# Patient Record
Sex: Female | Born: 1975 | Race: White | Hispanic: No | Marital: Married | Smoking: Never smoker
Health system: Southern US, Community
[De-identification: ages and names within clinical notes are randomized; demographics above are authoritative.]

## PROBLEM LIST (undated history)

## (undated) DIAGNOSIS — N200 Calculus of kidney: Secondary | ICD-10-CM

## (undated) DIAGNOSIS — R002 Palpitations: Secondary | ICD-10-CM

## (undated) DIAGNOSIS — E785 Hyperlipidemia, unspecified: Secondary | ICD-10-CM

## (undated) HISTORY — DX: Hyperlipidemia, unspecified: E78.5

## (undated) HISTORY — PX: LITHOTRIPSY: SUR834

## (undated) HISTORY — DX: Palpitations: R00.2

## (undated) HISTORY — DX: Calculus of kidney: N20.0

## (undated) HISTORY — PX: APPENDECTOMY: SHX54

## (undated) HISTORY — PX: BREAST LUMPECTOMY: SHX2

---

## 1998-08-31 ENCOUNTER — Other Ambulatory Visit: Admission: RE | Admit: 1998-08-31 | Discharge: 1998-08-31 | Payer: Self-pay | Admitting: Obstetrics & Gynecology

## 2000-07-04 ENCOUNTER — Encounter: Payer: Self-pay | Admitting: Urology

## 2000-07-04 ENCOUNTER — Ambulatory Visit (HOSPITAL_COMMUNITY): Admission: RE | Admit: 2000-07-04 | Discharge: 2000-07-04 | Payer: Self-pay | Admitting: Urology

## 2000-09-10 ENCOUNTER — Other Ambulatory Visit: Admission: RE | Admit: 2000-09-10 | Discharge: 2000-09-10 | Payer: Self-pay | Admitting: Obstetrics & Gynecology

## 2003-06-11 ENCOUNTER — Other Ambulatory Visit: Admission: RE | Admit: 2003-06-11 | Discharge: 2003-06-11 | Payer: Self-pay | Admitting: Obstetrics & Gynecology

## 2016-12-24 DIAGNOSIS — N841 Polyp of cervix uteri: Secondary | ICD-10-CM | POA: Insufficient documentation

## 2016-12-24 HISTORY — DX: Polyp of cervix uteri: N84.1

## 2016-12-28 HISTORY — PX: CERVICAL POLYPECTOMY: SHX88

## 2017-10-10 ENCOUNTER — Encounter (HOSPITAL_BASED_OUTPATIENT_CLINIC_OR_DEPARTMENT_OTHER): Payer: Self-pay | Admitting: *Deleted

## 2017-10-15 NOTE — H&P (Signed)
Emily [name] 10/01/2017 4:09 PM Location: [facility] Patient #: [id number] DOB: 09/30/1976 Married / Language: [place] / Race: White Female   History of Present Illness (Emily [name]; 10/01/2017 4:46 PM) The patient is a 41 year old female who presents for an evaluation of a hernia. This is a pleasant 41 year old female referred by Dr. Marylynn PearsonBrian [name] for evaluation of a symptomatic hernia just above the umbilicus. She was doing some heavy lifting which no signs hernia bulge out. She was able to reduce it but it was painful. Since then, she's been able to notice a small bulge above the umbilicus which is now fairly asymptomatic. She did not have any obstructive symptoms. She is otherwise healthy without complaints.   Past Surgical History Emily [name](Emily [name], [place]; 10/01/2017 4:09 PM) Appendectomy  Breast Biopsy  Right.  Diagnostic Studies History Emily [name](Emily [name], [place]; 10/01/2017 4:09 PM) Colonoscopy  never Mammogram  within last year Pap Smear  1-5 years ago  Allergies Emily [name](Emily [name], [place]; 10/01/2017 4:11 PM) No Known Drug Allergies 10/01/2017 Allergies Reconciled   Medication History Emily [name](Emily [name], RMA; 10/01/2017 4:12 PM) Probiotic (Oral) Active. Medications Reconciled  Social History Emily [name](Emily [name], [place]; 10/01/2017 4:09 PM) Alcohol use  Occasional alcohol use. Caffeine use  Tea. No drug use  Tobacco use  Never smoker.  Family History Emily [name](Emily [name], [place]; 10/01/2017 4:09 PM) Cancer  Mother. Colon Polyps  Father, Mother. Heart disease in female family member before age 43  Kidney Disease  Father.  Pregnancy / Birth History Emily [name](Emily [name], [place]; 10/01/2017 4:09 PM) Age at menarche  9 years. Gravida  2 Length (months) of breastfeeding  3-6 Maternal age  43-40 Para  1 Regular periods   Other Problems Emily [name](Emily [name], [place]; 10/01/2017 4:09 PM) Arthritis  Kidney Stone  Lump In Breast  Umbilical Hernia Repair     Review of  Systems Emily [name](Emily [name] RMA; 10/01/2017 4:09 PM) General Not Present- Appetite Loss, Chills, Fatigue, Fever, Night Sweats, Weight Gain and Weight Loss. Skin Not Present- Change in Wart/Mole, Dryness, Hives, Jaundice, New Lesions, Non-Healing Wounds, Rash and Ulcer. HEENT Present- Ringing in the Ears. Not Present- Earache, Hearing Loss, Hoarseness, Nose Bleed, Oral Ulcers, Seasonal Allergies, Sinus Pain, Sore Throat, Visual Disturbances, Wears glasses/contact lenses and Yellow Eyes. Respiratory Not Present- Bloody sputum, Chronic Cough, Difficulty Breathing, Snoring and Wheezing. Breast Not Present- Breast Mass, Breast Pain, Nipple Discharge and Skin Changes. Cardiovascular Not Present- Chest Pain, Difficulty Breathing Lying Down, Leg Cramps, Palpitations, Rapid Heart Rate, Shortness of Breath and Swelling of Extremities. Gastrointestinal Not Present- Abdominal Pain, Bloating, Bloody Stool, Change in Bowel Habits, Chronic diarrhea, Constipation, Difficulty Swallowing, Excessive gas, Gets full quickly at meals, Hemorrhoids, Indigestion, Nausea, Rectal Pain and Vomiting. Female Genitourinary Not Present- Frequency, Nocturia, Painful Urination, Pelvic Pain and Urgency. Musculoskeletal Not Present- Back Pain, Joint Pain, Joint Stiffness, Muscle Pain, Muscle Weakness and Swelling of Extremities. Neurological Not Present- Decreased Memory, Fainting, Headaches, Numbness, Seizures, Tingling, Tremor, Trouble walking and Weakness. Psychiatric Not Present- Anxiety, Bipolar, Change in Sleep Pattern, Depression, Fearful and Frequent crying. Endocrine Not Present- Cold Intolerance, Excessive Hunger, Hair Changes, Heat Intolerance, Hot flashes and New Diabetes. Hematology Not Present- Blood Thinners, Easy Bruising, Excessive bleeding, Gland problems, HIV and Persistent Infections.  Vitals Emily [name](Emily [name] RMA; 10/01/2017 4:11 PM) 10/01/2017 4:11 PM Weight: 196 lb Height: 62in Body Surface Area: 1.9 m Body Mass  Index: 35.85 kg/m  Temp.: 97.89F  Pulse: 97 (Regular)  BP: 140/82 (Sitting, Left Arm, Standard)  Physical Exam (Emily [name]; 10/01/2017 4:46 PM) The physical exam findings are as follows: Note:General she is well appearance Lungs are clear bilaterally Cardiovascular is regular rate and rhythm Abdomen is soft and nontender. There is a small, chronically incarcerated, nontender hernia just above the umbilicus containing omentum.    Assessment & Plan (Emily [name]; 10/01/2017 4:47 PM) UMBILICAL HERNIA (K42.9) Impression: I discussed the diagnosis with her in detail. Repair is recommended. I explained the surgical procedure in detail. I explained the risks of the hernia getting larger or becoming emergent if hernia surgery is not proceed. We discussed the risks of surgery which includes but is not limited to bleeding, infection, use of mesh, injury to surrounding structures, recurrent hernia, cardiopulmonary issues, etc. She understands and wished to proceed with surgery which will be scheduled

## 2017-10-16 ENCOUNTER — Ambulatory Visit (HOSPITAL_BASED_OUTPATIENT_CLINIC_OR_DEPARTMENT_OTHER): Payer: BC Managed Care – PPO | Admitting: Anesthesiology

## 2017-10-16 ENCOUNTER — Other Ambulatory Visit: Payer: Self-pay

## 2017-10-16 ENCOUNTER — Other Ambulatory Visit: Payer: Self-pay | Admitting: Surgery

## 2017-10-16 ENCOUNTER — Ambulatory Visit (HOSPITAL_BASED_OUTPATIENT_CLINIC_OR_DEPARTMENT_OTHER)
Admission: RE | Admit: 2017-10-16 | Discharge: 2017-10-16 | Disposition: A | Discharge status: Home / Self Care | Payer: BC Managed Care – PPO | Source: Ambulatory Visit | Attending: Surgery | Admitting: Surgery

## 2017-10-16 ENCOUNTER — Encounter (HOSPITAL_BASED_OUTPATIENT_CLINIC_OR_DEPARTMENT_OTHER): Payer: Self-pay | Admitting: Anesthesiology

## 2017-10-16 ENCOUNTER — Encounter (HOSPITAL_BASED_OUTPATIENT_CLINIC_OR_DEPARTMENT_OTHER)
Admission: RE | Disposition: A | Discharge status: Home / Self Care | Payer: Self-pay | Source: Ambulatory Visit | Attending: Surgery

## 2017-10-16 DIAGNOSIS — E669 Obesity, unspecified: Secondary | ICD-10-CM | POA: Insufficient documentation

## 2017-10-16 DIAGNOSIS — K429 Umbilical hernia without obstruction or gangrene: Secondary | ICD-10-CM | POA: Diagnosis not present

## 2017-10-16 DIAGNOSIS — Z6835 Body mass index (BMI) 35.0-35.9, adult: Secondary | ICD-10-CM | POA: Insufficient documentation

## 2017-10-16 HISTORY — PX: UMBILICAL HERNIA REPAIR: SHX196

## 2017-10-16 SURGERY — REPAIR, HERNIA, UMBILICAL, ADULT
Anesthesia: General | Site: Abdomen

## 2017-10-16 MED ORDER — OXYCODONE HCL 5 MG PO TABS: 5.0000 mg | ORAL_TABLET | Freq: Four times a day (QID) | ORAL | 0 refills | Status: DC | PRN

## 2017-10-16 MED ORDER — DEXAMETHASONE SODIUM PHOSPHATE 4 MG/ML IJ SOLN
INTRAMUSCULAR | Status: DC | PRN
  Administered 2017-10-16: 10 mg via INTRAVENOUS

## 2017-10-16 MED ORDER — FENTANYL CITRATE (PF) 100 MCG/2ML IJ SOLN
INTRAMUSCULAR | Status: AC
  Filled 2017-10-16: qty 2

## 2017-10-16 MED ORDER — LACTATED RINGERS IV SOLN
INTRAVENOUS | Status: DC
  Administered 2017-10-16: 09:00:00 via INTRAVENOUS

## 2017-10-16 MED ORDER — BUPIVACAINE-EPINEPHRINE 0.5% -1:200000 IJ SOLN
INTRAMUSCULAR | Status: DC | PRN
  Administered 2017-10-16: 10 mL

## 2017-10-16 MED ORDER — MEPERIDINE HCL 25 MG/ML IJ SOLN: 6.2500 mg | INTRAMUSCULAR | Status: DC | PRN

## 2017-10-16 MED ORDER — METOCLOPRAMIDE HCL 5 MG/ML IJ SOLN
10.0000 mg | Freq: Once | INTRAMUSCULAR | Status: DC | PRN
Start: 2017-10-16 — End: 2017-10-16

## 2017-10-16 MED ORDER — SCOPOLAMINE 1 MG/3DAYS TD PT72
MEDICATED_PATCH | TRANSDERMAL | Status: AC
  Filled 2017-10-16: qty 1

## 2017-10-16 MED ORDER — LIDOCAINE HCL (CARDIAC) 20 MG/ML IV SOLN
INTRAVENOUS | Status: DC | PRN
  Administered 2017-10-16: 100 mg via INTRAVENOUS

## 2017-10-16 MED ORDER — CHLORHEXIDINE GLUCONATE CLOTH 2 % EX PADS: 6.0000 | MEDICATED_PAD | Freq: Once | CUTANEOUS | Status: AC

## 2017-10-16 MED ORDER — PROPOFOL 10 MG/ML IV BOLUS
INTRAVENOUS | Status: DC | PRN
  Administered 2017-10-16: 200 mg via INTRAVENOUS

## 2017-10-16 MED ORDER — FENTANYL CITRATE (PF) 100 MCG/2ML IJ SOLN
25.0000 ug | INTRAMUSCULAR | Status: DC | PRN
Start: 2017-10-16 — End: 2017-10-16
  Administered 2017-10-16 (×2): 50 ug via INTRAVENOUS

## 2017-10-16 MED ORDER — FENTANYL CITRATE (PF) 100 MCG/2ML IJ SOLN
50.0000 ug | INTRAMUSCULAR | Status: DC | PRN
  Administered 2017-10-16: 100 ug via INTRAVENOUS

## 2017-10-16 MED ORDER — DEXTROSE 5 % IV SOLN
2.0000 g | INTRAVENOUS | Status: AC
  Administered 2017-10-16: 2 g via INTRAVENOUS

## 2017-10-16 MED ORDER — MIDAZOLAM HCL 2 MG/2ML IJ SOLN
INTRAMUSCULAR | Status: AC
  Filled 2017-10-16: qty 2

## 2017-10-16 MED ORDER — KETOROLAC TROMETHAMINE 30 MG/ML IJ SOLN
INTRAMUSCULAR | Status: DC | PRN
  Administered 2017-10-16: 30 mg via INTRAVENOUS

## 2017-10-16 MED ORDER — CEFAZOLIN SODIUM-DEXTROSE 2-4 GM/100ML-% IV SOLN
INTRAVENOUS | Status: AC
  Filled 2017-10-16: qty 100

## 2017-10-16 MED ORDER — SCOPOLAMINE 1 MG/3DAYS TD PT72: 1.0000 | MEDICATED_PATCH | TRANSDERMAL | Status: DC

## 2017-10-16 MED ORDER — SCOPOLAMINE 1 MG/3DAYS TD PT72
1.0000 | MEDICATED_PATCH | Freq: Once | TRANSDERMAL | Status: DC | PRN
  Administered 2017-10-16: 1.5 mg via TRANSDERMAL

## 2017-10-16 MED ORDER — HYDROCODONE-ACETAMINOPHEN 7.5-325 MG PO TABS: 1.0000 | ORAL_TABLET | Freq: Once | ORAL | Status: DC | PRN

## 2017-10-16 MED ORDER — MIDAZOLAM HCL 2 MG/2ML IJ SOLN
1.0000 mg | INTRAMUSCULAR | Status: DC | PRN
  Administered 2017-10-16: 2 mg via INTRAVENOUS

## 2017-10-16 SURGICAL SUPPLY — 43 items
BLADE HEX COATED 2.75 (ELECTRODE) ×4 IMPLANT
BLADE SURG 15 STRL LF DISP TIS (BLADE) ×2 IMPLANT
BLADE SURG 15 STRL SS (BLADE) ×2
CANISTER SUCT 1200ML W/VALVE (MISCELLANEOUS) IMPLANT
CHLORAPREP W/TINT 26ML (MISCELLANEOUS) ×4 IMPLANT
COVER BACK TABLE 60X90IN (DRAPES) ×4 IMPLANT
COVER MAYO STAND STRL (DRAPES) ×4 IMPLANT
DECANTER SPIKE VIAL GLASS SM (MISCELLANEOUS) IMPLANT
DERMABOND ADVANCED (GAUZE/BANDAGES/DRESSINGS) ×2
DERMABOND ADVANCED .7 DNX12 (GAUZE/BANDAGES/DRESSINGS) ×2 IMPLANT
DRAPE LAPAROTOMY 100X72 PEDS (DRAPES) ×4 IMPLANT
DRAPE UTILITY XL STRL (DRAPES) ×4 IMPLANT
ELECT REM PT RETURN 9FT ADLT (ELECTROSURGICAL) ×4
ELECTRODE REM PT RTRN 9FT ADLT (ELECTROSURGICAL) ×2 IMPLANT
GLOVE BIO SURGEON STRL SZ 6.5 (GLOVE) ×3 IMPLANT
GLOVE BIO SURGEONS STRL SZ 6.5 (GLOVE) ×1
GLOVE BIOGEL PI IND STRL 7.0 (GLOVE) ×2 IMPLANT
GLOVE BIOGEL PI INDICATOR 7.0 (GLOVE) ×2
GLOVE SURG SIGNA 7.5 PF LTX (GLOVE) ×4 IMPLANT
GOWN STRL REUS W/ TWL LRG LVL3 (GOWN DISPOSABLE) ×2 IMPLANT
GOWN STRL REUS W/ TWL XL LVL3 (GOWN DISPOSABLE) ×2 IMPLANT
GOWN STRL REUS W/TWL LRG LVL3 (GOWN DISPOSABLE) ×2
GOWN STRL REUS W/TWL XL LVL3 (GOWN DISPOSABLE) ×2
NEEDLE HYPO 25X1 1.5 SAFETY (NEEDLE) ×4 IMPLANT
NS IRRIG 1000ML POUR BTL (IV SOLUTION) IMPLANT
PACK BASIN DAY SURGERY FS (CUSTOM PROCEDURE TRAY) ×4 IMPLANT
PENCIL BUTTON HOLSTER BLD 10FT (ELECTRODE) ×4 IMPLANT
SLEEVE SCD COMPRESS KNEE MED (MISCELLANEOUS) ×4 IMPLANT
SPONGE LAP 4X18 X RAY DECT (DISPOSABLE) ×4 IMPLANT
SUT ETHIBOND 0 MO6 C/R (SUTURE) ×4 IMPLANT
SUT MNCRL AB 4-0 PS2 18 (SUTURE) ×4 IMPLANT
SUT NOVA 0 T19/GS 22DT (SUTURE) IMPLANT
SUT NOVA NAB GS-21 1 T12 (SUTURE) IMPLANT
SUT VIC AB 2-0 SH 27 (SUTURE)
SUT VIC AB 2-0 SH 27XBRD (SUTURE) IMPLANT
SUT VIC AB 3-0 SH 27 (SUTURE) ×2
SUT VIC AB 3-0 SH 27X BRD (SUTURE) ×2 IMPLANT
SYR CONTROL 10ML LL (SYRINGE) ×4 IMPLANT
TOWEL OR 17X24 6PK STRL BLUE (TOWEL DISPOSABLE) ×4 IMPLANT
TOWEL OR NON WOVEN STRL DISP B (DISPOSABLE) ×4 IMPLANT
TUBE CONNECTING 20'X1/4 (TUBING)
TUBE CONNECTING 20X1/4 (TUBING) IMPLANT
YANKAUER SUCT BULB TIP NO VENT (SUCTIONS) IMPLANT

## 2017-10-16 NOTE — Anesthesia Preprocedure Evaluation (Signed)
Anesthesia Evaluation  Patient identified by MRN, date of birth, ID band Patient awake    Reviewed: Allergy & Precautions, NPO status , Patient's Chart, lab work & pertinent test results  Airway Mallampati: II  TM Distance: >3 FB Neck ROM: Full    Dental no notable dental hx. (+) Teeth Intact   Pulmonary neg pulmonary ROS,    Pulmonary exam normal breath sounds clear to auscultation       Cardiovascular negative cardio ROS Normal cardiovascular exam Rhythm:Regular Rate:Normal     Neuro/Psych negative neurological ROS  negative psych ROS   GI/Hepatic negative GI ROS, Neg liver ROS,   Endo/Other  Obesity  Renal/GU Renal diseaseHx/o renal calculi  negative genitourinary   Musculoskeletal Umbilical hernia   Abdominal (+) + obese,   Peds  Hematology negative hematology ROS (+)   Anesthesia Other Findings   Reproductive/Obstetrics                             Anesthesia Physical Anesthesia Plan  ASA: II  Anesthesia Plan: General   Post-op Pain Management:    Induction: Intravenous  PONV Risk Score and Plan: 4 or greater and Scopolamine patch - Pre-op, Midazolam, Dexamethasone, Ondansetron and Treatment may vary due to age or medical condition  Airway Management Planned: Oral ETT  Additional Equipment:   Intra-op Plan:   Post-operative Plan: Extubation in OR  Informed Consent: I have reviewed the patients History and Physical, chart, labs and discussed the procedure including the risks, benefits and alternatives for the proposed anesthesia with the patient or authorized representative who has indicated his/her understanding and acceptance.   Dental advisory given  Plan Discussed with: CRNA, Anesthesiologist and Surgeon  Anesthesia Plan Comments:         Anesthesia Quick Evaluation

## 2017-10-16 NOTE — Discharge Instructions (Signed)
°Post Anesthesia Home Care Instructions ° °Activity: °Get plenty of rest for the remainder of the day. A responsible individual must stay with you for 24 hours following the procedure.  °For the next 24 hours, DO NOT: °-Drive a car °-Operate machinery °-Drink alcoholic beverages °-Take any medication unless instructed by your physician °-Make any legal decisions or sign important papers. ° °Meals: °Start with liquid foods such as gelatin or soup. Progress to regular foods as tolerated. Avoid greasy, spicy, heavy foods. If nausea and/or vomiting occur, drink only clear liquids until the nausea and/or vomiting subsides. Call your physician if vomiting continues. ° °Special Instructions/Symptoms: °Your throat may feel dry or sore from the anesthesia or the breathing tube placed in your throat during surgery. If this causes discomfort, gargle with warm salt water. The discomfort should disappear within 24 hours. ° °If you had a scopolamine patch placed behind your ear for the management of post- operative nausea and/or vomiting: ° °1. The medication in the patch is effective for 72 hours, after which it should be removed.  Wrap patch in a tissue and discard in the trash. Wash hands thoroughly with soap and water. °2. You may remove the patch earlier than 72 hours if you experience unpleasant side effects which may include dry mouth, dizziness or visual disturbances. °3. Avoid touching the patch. Wash your hands with soap and water after contact with the patch. °  ° ° ° ° °[facility] _______[facility] ° °UMBILICAL OR INGUINAL HERNIA REPAIR: POST OP INSTRUCTIONS ° °Always review your discharge instruction sheet given to you by the facility where your surgery was performed. °IF YOU HAVE DISABILITY OR FAMILY LEAVE FORMS, YOU MUST BRING THEM TO THE OFFICE FOR PROCESSING.   °DO NOT GIVE THEM TO YOUR DOCTOR. ° °1. A  prescription for pain medication may be given to you upon discharge.  Take your pain medication as  prescribed, if needed.  If narcotic pain medicine is not needed, then you may take acetaminophen (Tylenol) or ibuprofen (Advil) as needed. °2. Take your usually prescribed medications unless otherwise directed. °If you need a refill on your pain medication, please contact your pharmacy.  They will contact our office to request authorization. Prescriptions will not be filled after 5 pm or on week-ends. °3. You should follow a light diet the first 24 hours after arrival home, such as soup and crackers, etc.  Be sure to include lots of fluids daily.  Resume your normal diet the day after surgery. °4.Most patients will experience some swelling and bruising around the umbilicus or in the groin and scrotum.  Ice packs and reclining will help.  Swelling and bruising can take several days to resolve.  °6. It is common to experience some constipation if taking pain medication after surgery.  Increasing fluid intake and taking a stool softener (such as Colace) will usually help or prevent this problem from occurring.  A mild laxative (Milk of Magnesia or Miralax) should be taken according to package directions if there are no bowel movements after 48 hours. °7. Unless discharge instructions indicate otherwise, you may remove your bandages 24-48 hours after surgery, and you may shower at that time.  You may have steri-strips (small skin tapes) in place directly over the incision.  These strips should be left on the skin for 7-10 days.  If your surgeon used skin glue on the incision, you may shower in 24 hours.  The glue will flake off over the next 2-3 weeks.    Any sutures or staples will be removed at the office during your follow-up visit. °8. ACTIVITIES:  You may resume regular (light) daily activities beginning the next day--such as daily self-care, walking, climbing stairs--gradually increasing activities as tolerated.  You may have sexual intercourse when it is comfortable.  Refrain from any heavy lifting or straining  until approved by your doctor. ° °a.You may drive when you are no longer taking prescription pain medication, you can comfortably wear a seatbelt, and you can safely maneuver your car and apply brakes. °b.RETURN TO WORK:   °_____________________________________________ ° °9.You should see your doctor in the office for a follow-up appointment approximately 2-3 weeks after your surgery.  Make sure that you call for this appointment within a day or two after you arrive home to insure a convenient appointment time. °10.OTHER INSTRUCTIONS: _________________________ °   _____________________________________ ° °WHEN TO CALL YOUR DOCTOR: °1. Fever over 101.0 °2. Inability to urinate °3. Nausea and/or vomiting °4. Extreme swelling or bruising °5. Continued bleeding from incision. °6. Increased pain, redness, or drainage from the incision ° °The clinic staff is available to answer your questions during regular business hours.  Please don’t hesitate to call and ask to speak to one of the nurses for clinical concerns.  If you have a medical emergency, go to the nearest emergency room or call 911.  A surgeon from [facility] is always on call at the hospital ° ° °[address], [address], [place], [place]  [address] ? ° [address] [address], [place], [place]   [address] °[phone] ? [phone] ? FAX [phone] °Web site: [email or web address] °

## 2017-10-16 NOTE — Anesthesia Procedure Notes (Signed)
Procedure Name: LMA Insertion Date/Time: 10/16/2017 10:05 AM Performed by: Ronnette HilaPayne, Kannon Granderson D, CRNA Pre-anesthesia Checklist: Patient identified, Emergency Drugs available, Suction available and Patient being monitored Patient Re-evaluated:Patient Re-evaluated prior to induction Oxygen Delivery Method: Circle system utilized Preoxygenation: Pre-oxygenation with 100% oxygen Induction Type: IV induction Ventilation: Mask ventilation without difficulty LMA: LMA inserted LMA Size: 4.0 Number of attempts: 1 Airway Equipment and Method: Bite block Placement Confirmation: positive ETCO2 Tube secured with: Tape Dental Injury: Teeth and Oropharynx as per pre-operative assessment

## 2017-10-16 NOTE — Transfer of Care (Signed)
Immediate Anesthesia Transfer of Care Note  Patient: Emily [name]  Procedure(s) Performed: UMBILICAL HERNIA REPAIR (N/A Abdomen)  Patient Location: PACU  Anesthesia Type:General  Level of Consciousness: awake, alert  and drowsy  Airway & Oxygen Therapy: Patient Spontanous Breathing and Patient connected to face mask oxygen  Post-op Assessment: Report given to RN and Post -op Vital signs reviewed and stable  Post vital signs: Reviewed and stable  Last Vitals:  Vitals:   10/16/17 0843  BP: 127/71  Pulse: 68  Resp: 20  Temp: 36.6 C  SpO2: 100%    Last Pain:  Vitals:   10/16/17 0843  TempSrc: Oral         Complications: No apparent anesthesia complications

## 2017-10-16 NOTE — Op Note (Signed)
UMBILICAL HERNIA REPAIR  Procedure Note  Emily [name] 10/16/2017   Pre-op Diagnosis: UMBILICAL HERNIA     Post-op Diagnosis: same  Procedure(s): UMBILICAL HERNIA REPAIR  Surgeon(s): Abigail MiyamotoBlackman, Relena Ivancic, MD  Anesthesia: General  Staff:  Circulator: Maryan RuedWeaver, Brandi C, RN Relief Circulator: Salley Scarletavidson, Mary B, RN Relief Scrub: Macie BurowsBowyer, Amy M, RN Scrub Person: Wardell HeathLewis, Dawn M, CST  Estimated Blood Loss: Minimal               Procedure: The patient was brought to the operating room and identified as the correct patient.  She was placed supine on the operating room table and general anesthesia was induced.  Her abdomen was prepped and draped in the usual sterile fashion.  The small hernia was located just above the umbilicus.  I anesthetized the skin with Marcaine and made [name] small vertical incision with [name] scalpel.  I took this down to the small hernia.  The hernia sac was found to contain only preperitoneal fat.  The fascial defect was very small.  I excised the sac and the contents.  I then closed the fascial defect with several figure-of-eight 0 Ethibond sutures.  I anesthetized the fascia first with Marcaine.  Good closure appeared to be achieved.  Hemostasis was achieved.  I then closed the subtenons tissue with interrupted 3-0 Vicryl sutures and closed the skin with [name] running 4-0 Monocryl.  Skin glue was then applied.  The patient tolerated the procedure well.  All the counts were correct at the end of the procedure.  The patient was then extubated in the operating room and taken in [name] stable condition to the recovery room.          Emily [name]   Date: 10/16/2017  Time: 10:36 AM

## 2017-10-16 NOTE — Anesthesia Postprocedure Evaluation (Addendum)
Anesthesia Post Note  Patient: Emily [name]  Procedure(s) Performed: UMBILICAL HERNIA REPAIR (N/A Abdomen)     Patient location during evaluation: PACU Anesthesia Type: General Level of consciousness: awake and alert and oriented Pain management: pain level controlled Vital Signs Assessment: post-procedure vital signs reviewed and stable Respiratory status: spontaneous breathing, nonlabored ventilation and respiratory function stable Cardiovascular status: blood pressure returned to baseline and stable Postop Assessment: no apparent nausea or vomiting Anesthetic complications: no    Last Vitals:  Vitals:   10/16/17 1115 10/16/17 1131  BP: 116/73 112/68  Pulse: 71 80  Resp: 10 16  Temp:  36.6 C  SpO2: 94% 95%    Last Pain:  Vitals:   10/16/17 1131  TempSrc:   PainSc: 2                  Summer Mccolgan A.

## 2017-10-16 NOTE — Interval H&P Note (Signed)
History and Physical Interval Note: no change in H and P  10/16/2017 9:42 AM  Emily [name]  has presented today for surgery, with the diagnosis of UMBILICAL HERNIA  The various methods of treatment have been discussed with the patient and family. After consideration of risks, benefits and other options for treatment, the patient has consented to  Procedure(s): UMBILICAL HERNIA REPAIR WITH POSSIBLE MESH (N/A) INSERTION OF MESH (N/A) as a surgical intervention .  The patient's history has been reviewed, patient examined, no change in status, stable for surgery.  I have reviewed the patient's chart and labs.  Questions were answered to the patient's satisfaction.     Archie Atilano A

## 2017-10-17 ENCOUNTER — Encounter (HOSPITAL_BASED_OUTPATIENT_CLINIC_OR_DEPARTMENT_OTHER): Payer: Self-pay | Admitting: Surgery

## 2019-01-04 ENCOUNTER — Other Ambulatory Visit: Payer: Self-pay

## 2019-01-04 ENCOUNTER — Encounter (HOSPITAL_COMMUNITY): Payer: Self-pay | Admitting: Emergency Medicine

## 2019-01-04 ENCOUNTER — Emergency Department (HOSPITAL_COMMUNITY)
Admission: EM | Admit: 2019-01-04 | Discharge: 2019-01-04 | Disposition: A | Discharge status: Home / Self Care | Payer: BC Managed Care – PPO | Attending: Emergency Medicine | Admitting: Emergency Medicine

## 2019-01-04 ENCOUNTER — Emergency Department (HOSPITAL_COMMUNITY): Payer: BC Managed Care – PPO

## 2019-01-04 DIAGNOSIS — K5732 Diverticulitis of large intestine without perforation or abscess without bleeding: Secondary | ICD-10-CM | POA: Diagnosis not present

## 2019-01-04 DIAGNOSIS — R1084 Generalized abdominal pain: Secondary | ICD-10-CM | POA: Diagnosis present

## 2019-01-04 DIAGNOSIS — Z79899 Other long term (current) drug therapy: Secondary | ICD-10-CM | POA: Diagnosis not present

## 2019-01-04 LAB — COMPREHENSIVE METABOLIC PANEL
ALT: 30 U/L (ref 0–44)
AST: 29 U/L (ref 15–41)
Albumin: 3.8 g/dL (ref 3.5–5.0)
Alkaline Phosphatase: 94 U/L (ref 38–126)
Anion gap: 11 (ref 5–15)
BUN: 7 mg/dL (ref 6–20)
CO2: 23 mmol/L (ref 22–32)
Calcium: 9 mg/dL (ref 8.9–10.3)
Chloride: 100 mmol/L (ref 98–111)
Creatinine, Ser: 0.74 mg/dL (ref 0.44–1.00)
GFR calc Af Amer: >60 mL/min (ref >60)
GFR calc non Af Amer: >60 mL/min (ref >60)
Glucose, Bld: 101 mg/dL — ABNORMAL HIGH (ref 70–99)
Potassium: 3.5 mmol/L (ref 3.5–5.1)
Sodium: 134 mmol/L — ABNORMAL LOW (ref 135–145)
Total Bilirubin: 1 mg/dL (ref 0.3–1.2)
Total Protein: 7.3 g/dL (ref 6.5–8.1)

## 2019-01-04 LAB — CBC WITH DIFFERENTIAL/PLATELET
Abs Immature Granulocytes: 0.1 10*3/uL — ABNORMAL HIGH (ref 0.00–0.07)
Basophils Absolute: 0.1 10*3/uL (ref 0.0–0.1)
Basophils Relative: 0 %
Eosinophils Absolute: 0.1 10*3/uL (ref 0.0–0.5)
Eosinophils Relative: 0 %
HCT: 40.9 % (ref 36.0–46.0)
Hemoglobin: 13.2 g/dL (ref 12.0–15.0)
Immature Granulocytes: 1 %
Lymphocytes Relative: 10 %
Lymphs Abs: 1.8 10*3/uL (ref 0.7–4.0)
MCH: 28.1 pg (ref 26.0–34.0)
MCHC: 32.3 g/dL (ref 30.0–36.0)
MCV: 87 fL (ref 80.0–100.0)
Monocytes Absolute: 1.3 10*3/uL — ABNORMAL HIGH (ref 0.1–1.0)
Monocytes Relative: 7 %
Neutro Abs: 15.5 10*3/uL — ABNORMAL HIGH (ref 1.7–7.7)
Neutrophils Relative %: 82 %
Platelets: 274 10*3/uL (ref 150–400)
RBC: 4.7 MIL/uL (ref 3.87–5.11)
RDW: 12 % (ref 11.5–15.5)
WBC: 18.8 10*3/uL — ABNORMAL HIGH (ref 4.0–10.5)
nRBC: 0 % (ref 0.0–0.2)

## 2019-01-04 LAB — I-STAT BETA HCG BLOOD, ED (MC, WL, AP ONLY): I-stat hCG, quantitative: <5 m[IU]/mL (ref <5)

## 2019-01-04 LAB — LIPASE, BLOOD: Lipase: 20 U/L (ref 11–51)

## 2019-01-04 LAB — URINALYSIS, ROUTINE W REFLEX MICROSCOPIC
Bilirubin Urine: NEGATIVE
Glucose, UA: NEGATIVE mg/dL
Hgb urine dipstick: NEGATIVE
Ketones, ur: 20 mg/dL — AB
Leukocytes,Ua: NEGATIVE
Nitrite: NEGATIVE
Protein, ur: NEGATIVE mg/dL
Specific Gravity, Urine: 1.024 (ref 1.005–1.030)
pH: 6 (ref 5.0–8.0)

## 2019-01-04 MED ORDER — SODIUM CHLORIDE 0.9 % IV BOLUS
1000.0000 mL | Freq: Once | INTRAVENOUS | Status: AC
  Administered 2019-01-04: 1000 mL via INTRAVENOUS

## 2019-01-04 MED ORDER — OXYCODONE-ACETAMINOPHEN 5-325 MG PO TABS: 1.0000 | ORAL_TABLET | ORAL | 0 refills | Status: DC | PRN

## 2019-01-04 MED ORDER — ONDANSETRON HCL 4 MG/2ML IJ SOLN
4.0000 mg | Freq: Once | INTRAMUSCULAR | Status: AC
  Administered 2019-01-04: 4 mg via INTRAVENOUS
  Filled 2019-01-04: qty 2

## 2019-01-04 MED ORDER — SODIUM CHLORIDE 0.9 % IV SOLN
2.0000 g | Freq: Once | INTRAVENOUS | Status: AC
  Administered 2019-01-04: 2 g via INTRAVENOUS
  Filled 2019-01-04: qty 20

## 2019-01-04 MED ORDER — METRONIDAZOLE IN NACL 5-0.79 MG/ML-% IV SOLN
500.0000 mg | Freq: Once | INTRAVENOUS | Status: AC
  Administered 2019-01-04: 500 mg via INTRAVENOUS
  Filled 2019-01-04: qty 100

## 2019-01-04 MED ORDER — CIPROFLOXACIN HCL 500 MG PO TABS: 500.0000 mg | ORAL_TABLET | Freq: Two times a day (BID) | ORAL | 0 refills | Status: AC

## 2019-01-04 MED ORDER — ONDANSETRON 4 MG PO TBDP: 4.0000 mg | ORAL_TABLET | Freq: Three times a day (TID) | ORAL | 0 refills | Status: DC | PRN

## 2019-01-04 MED ORDER — IOHEXOL 300 MG/ML  SOLN
100.0000 mL | Freq: Once | INTRAMUSCULAR | Status: AC | PRN
  Administered 2019-01-04: 100 mL via INTRAVENOUS

## 2019-01-04 MED ORDER — METRONIDAZOLE 500 MG PO TABS: 500.0000 mg | ORAL_TABLET | Freq: Three times a day (TID) | ORAL | 0 refills | Status: AC

## 2019-01-04 MED ORDER — FAMOTIDINE IN NACL 20-0.9 MG/50ML-% IV SOLN
20.0000 mg | Freq: Once | INTRAVENOUS | Status: AC
  Administered 2019-01-04: 20 mg via INTRAVENOUS
  Filled 2019-01-04: qty 50

## 2019-01-04 NOTE — ED Triage Notes (Signed)
Pt comes from urgent care. Pt complains of abdominal pain. Pt had her blood work drawn and her WBC count was 19.9

## 2019-01-04 NOTE — ED Provider Notes (Signed)
[facility] EMERGENCY DEPARTMENT Provider Note   CSN: [id number] Arrival date & time: 01/04/19  1625    History   Chief Complaint No chief complaint on file.   HPI Carmin Orta is a 43 y.o. female with a past surgical history of appendectomy, umbilical hernia repair presents to ED for mid abdominal pain, nausea for the past 2 days.  She was seen and evaluated at urgent care and sent to the ED due to leukocytosis of 19.  She states that the pain is intermittent, no improvement with stool softener, antacid or pain medication left over from kidney stone.  She had a normal bowel movement yesterday and today.  She denies history of similar symptoms in the past.  No sick contacts with similar symptoms.  Denies any urinary symptoms, vaginal complaints. Cannot recall any suspicious food ingestions or recent travel.  States that they also checked a urinalysis at urgent care which was unremarkable.  Denies any alcohol, tobacco or other drug use, chest pain or shortness of breath.     HPI  History reviewed. No pertinent past medical history.  There are no active problems to display for this patient.   Past Surgical History:  Procedure Laterality Date  . APPENDECTOMY    . BREAST LUMPECTOMY    . LITHOTRIPSY    . UMBILICAL HERNIA REPAIR N/A 10/16/2017   Procedure: UMBILICAL HERNIA REPAIR;  Surgeon: Abigail Miyamoto, MD;  Location: [facility];  Service: General;  Laterality: N/A;     OB History   No obstetric history on file.      Home Medications    Prior to Admission medications   Medication Sig Start Date End Date Taking? Authorizing Provider  Docusate Sodium (COLACE PO) Take 1 capsule by mouth daily as needed (constipation).   Yes [provider]  Probiotic Product (PROBIOTIC-10 PO) Take 1 tablet by mouth daily as needed (immune boost).    Yes [provider]  Simethicone (GAS-X PO) Take 1 tablet by mouth once.   Yes [provider]  ciprofloxacin (CIPRO) 500 MG tablet Take 1 tablet (500 mg total) by mouth every 12 (twelve) hours for 7 days. 01/04/19 01/11/19  Rudolph Daoust, PA-C  metroNIDAZOLE (FLAGYL) 500 MG tablet Take 1 tablet (500 mg total) by mouth 3 (three) times daily for 7 days. 01/04/19 01/11/19  Khrystyne Arpin, PA-C  ondansetron (ZOFRAN ODT) 4 MG disintegrating tablet Take 1 tablet (4 mg total) by mouth every 8 (eight) hours as needed for nausea or vomiting. 01/04/19   Cadi Rhinehart, PA-C  oxyCODONE (OXY IR/ROXICODONE) 5 MG immediate release tablet Take 1-2 tablets (5-10 mg total) by mouth every 6 (six) hours as needed for moderate pain, severe pain or breakthrough pain. Patient not taking: Reported on 01/04/2019 10/16/17   Abigail Miyamoto, MD  oxyCODONE-acetaminophen (PERCOCET/ROXICET) 5-325 MG tablet Take 1 tablet by mouth every 4 (four) hours as needed for severe pain. 01/04/19   Dietrich Pates, PA-C    Family History No family history on file.  Social History Social History   Tobacco Use  . Smoking status: Never Smoker  . Smokeless tobacco: Never Used  Substance Use Topics  . Alcohol use: No    Frequency: Never  . Drug use: No     Allergies   Patient has no known allergies.   Review of Systems Review of Systems  Constitutional: Negative for appetite change, chills and fever.  HENT: Negative for ear pain, rhinorrhea, sneezing and sore throat.  Eyes: Negative for photophobia and visual disturbance.  Respiratory: Negative for cough, chest tightness, shortness of breath and wheezing.   Cardiovascular: Negative for chest pain and palpitations.  Gastrointestinal: Positive for abdominal pain and nausea. Negative for blood in stool, constipation, diarrhea and vomiting.  Genitourinary: Negative for dysuria, hematuria and urgency.  Musculoskeletal: Negative for myalgias.  Skin: Negative for rash.  Neurological: Negative for dizziness, weakness and light-headedness.     Physical Exam Updated  Vital Signs BP 118/77 (BP Location: Right Arm)   Pulse (!) 103   Temp 98.4 F (36.9 C) (Oral)   Resp 20   Ht 5\' 2"  (1.575 m)   Wt 90.3 kg   SpO2 96%   BMI 36.40 kg/m   Physical Exam Vitals signs and nursing note reviewed.  Constitutional:      General: She is not in acute distress.    Appearance: She is well-developed.  HENT:     Head: Normocephalic and atraumatic.     Nose: Nose normal.  Eyes:     General: No scleral icterus.       Right eye: No discharge.        Left eye: No discharge.     Conjunctiva/sclera: Conjunctivae normal.  Neck:     Musculoskeletal: Normal range of motion and neck supple.  Cardiovascular:     Rate and Rhythm: Normal rate and regular rhythm.     Heart sounds: Normal heart sounds. No murmur. No friction rub. No gallop.   Pulmonary:     Effort: Pulmonary effort is normal. No respiratory distress.     Breath sounds: Normal breath sounds.  Abdominal:     General: Bowel sounds are normal. There is no distension.     Palpations: Abdomen is soft.     Tenderness: There is abdominal tenderness (Epigastric/periumbilical). There is no right CVA tenderness, left CVA tenderness, guarding or rebound.  Musculoskeletal: Normal range of motion.  Skin:    General: Skin is warm and dry.     Findings: No rash.  Neurological:     Mental Status: She is alert.     Motor: No abnormal muscle tone.     Coordination: Coordination normal.      ED Treatments / Results  Labs (all labs ordered are listed, but only abnormal results are displayed) Labs Reviewed  COMPREHENSIVE METABOLIC PANEL - Abnormal; Notable for the following components:      Result Value   Sodium 134 (*)    Glucose, Bld 101 (*)    All other components within normal limits  CBC WITH DIFFERENTIAL/PLATELET - Abnormal; Notable for the following components:   WBC 18.8 (*)    Neutro Abs 15.5 (*)    Monocytes Absolute 1.3 (*)    Abs Immature Granulocytes 0.10 (*)    All other components within  normal limits  URINALYSIS, ROUTINE W REFLEX MICROSCOPIC - Abnormal; Notable for the following components:   Ketones, ur 20 (*)    All other components within normal limits  LIPASE, BLOOD  I-STAT BETA HCG BLOOD, ED ([facility], [facility], [facility] ONLY)    EKG None  Radiology Ct Abdomen Pelvis W Contrast  Result Date: 01/04/2019 CLINICAL DATA:  Midline abdominal pain. Abdominal pain, acute, generalized. Personal history of umbilical hernia repair, lithotripsy, and appendectomy. EXAM: CT ABDOMEN AND PELVIS WITH CONTRAST TECHNIQUE: Multidetector CT imaging of the abdomen and pelvis was performed using the standard protocol following bolus administration of intravenous contrast. CONTRAST:  OMNIPAQUE IOHEXOL 300 MG/ML  SOLN COMPARISON:  CT of the abdomen and pelvis 05/13/2015 FINDINGS: Lower chest: Lung bases are clear without focal nodule, mass, or airspace disease. Heart size is normal. No significant pleural or pericardial effusion is present. Hepatobiliary: 2 cm cyst in the right lobe of the liver is stable. Hypodense lesion inferiorly in the right lobe is also stable. No new lesions are present. The common bile duct and gallbladder normal. Pancreas: Unremarkable. No pancreatic ductal dilatation or surrounding inflammatory changes. Spleen: Normal in size without focal abnormality. Adrenals/Urinary Tract: Adrenal glands are normal bilaterally. Lower pole central sinus cyst of the right kidney has increased in size, now measuring up to 4.4 cm. No other focal renal lesions are present. There is no stone or obstruction. The urinary bladder is within normal limits. Stomach/Bowel: Stomach and duodenum are within normal limits. Small bowel is within normal limits proximally. There is some secondary inflammatory change distally. Appendectomy is noted. The ascending colon is within normal limits. Transverse and proximal descending colon are normal. There are some diverticular changes in the distal descending colon. Extensive  inflammatory changes are present about the sigmoid colon compatible with acute diverticulitis. There is no discrete abscess or free air. Vascular/Lymphatic: No significant vascular findings are present. No enlarged abdominal or pelvic lymph nodes. Reproductive: A 4 cm fibroid is present at the uterine fundus. Uterus is otherwise within normal limits. Multiple follicles are present in the left adnexa measuring up to 2.4 cm. Other: No free fluid or free air is present. No significant ventral hernia is present. Musculoskeletal: Transitional S1 segment is present. A vacuum disc is present at L5-S1. Pelvis is intact. Hips are located and normal. IMPRESSION: 1. Sigmoid diverticulitis without abscess or perforation. 2. Secondary inflammatory changes of the distal small bowel without obstruction. 3. Enlarging central sinus cyst of the right kidney. 4. Benign liver cysts. Electronically Signed   By: Marin Roberts M.D.   On: 01/04/2019 19:03    Procedures Procedures (including critical care time)  Medications Ordered in ED Medications  sodium chloride 0.9 % bolus 1,000 mL (0 mLs Intravenous Stopped 01/04/19 1830)  ondansetron (ZOFRAN) injection 4 mg (4 mg Intravenous Given 01/04/19 1728)  famotidine (PEPCID) IVPB 20 mg premix (0 mg Intravenous Stopped 01/04/19 1810)  iohexol (OMNIPAQUE) 300 MG/ML solution 100 mL (100 mLs Intravenous Contrast Given 01/04/19 1806)  cefTRIAXone (ROCEPHIN) 2 g in sodium chloride 0.9 % 100 mL IVPB (0 g Intravenous Stopped 01/04/19 2008)    And  metroNIDAZOLE (FLAGYL) IVPB 500 mg (0 mg Intravenous Stopped 01/04/19 2139)     Initial Impression / Assessment and Plan / ED Course  I have reviewed the triage vital signs and the nursing notes.  Pertinent labs & imaging results that were available during my care of the patient were reviewed by me and considered in my medical decision making (see chart for details).        43yo F presents to ED for mid abdominal pain, nausea for the  past 2 days.  She was sent to the ED from urgent care for leukocytosis at 19.  She has no improvement with stool softener, antacid or pain medication.  Normal bowel movement yesterday and today.  On my exam tenderness palpation of the abdomen without rebound or guarding.  Her vital signs are within normal limits.  Leukocytosis of 18.8 here.  CMP, lipase, hCG unremarkable.  Urinalysis with ketones with no acute abnormality.  CT shows uncomplicated sigmoid diverticulitis.  Patient given IV antibiotics here will be discharged with remainder of  p.o. course.  Will give PRN pain medication and Zofran and have her follow-up with PCP.  Patient comfortable here with improvement in symptoms.  Patient is hemodynamically stable, in NAD, and able to ambulate in the ED. Evaluation does not show pathology that would require ongoing emergent intervention or inpatient treatment. I explained the diagnosis to the patient. Pain has been managed and has no complaints prior to discharge. Patient is comfortable with above plan and is stable for discharge at this time. All questions were answered prior to disposition. Strict return precautions for returning to the ED were discussed. Encouraged follow up with PCP.    Portions of this note were generated with Scientist, clinical (histocompatibility and immunogenetics). Dictation errors may occur despite best attempts at proofreading.   Final Clinical Impressions(s) / ED Diagnoses   Final diagnoses:  Diverticulitis of large intestine without perforation or abscess without bleeding    ED Discharge Orders         Ordered    metroNIDAZOLE (FLAGYL) 500 MG tablet  3 times daily     01/04/19 2031    ciprofloxacin (CIPRO) 500 MG tablet  Every 12 hours     01/04/19 2031    oxyCODONE-acetaminophen (PERCOCET/ROXICET) 5-325 MG tablet  Every 4 hours PRN     01/04/19 2031    ondansetron (ZOFRAN ODT) 4 MG disintegrating tablet  Every 8 hours PRN     01/04/19 2031           Dietrich Pates, PA-C 01/04/19 2306      Melene Plan, DO 01/04/19 2311

## 2019-01-04 NOTE — ED Notes (Signed)
Patient verbalizes understanding of discharge instructions. Opportunity for questioning and answers were provided. Armband removed by staff, pt discharged from ED, ambulatory to lobby with significant other.  

## 2019-01-04 NOTE — Discharge Instructions (Addendum)
Please take the entire course of antibiotics regardless of symptom improvement to prevent worsening or recurrence of your infection. Follow-up with your primary care provider. Return to the ED for worsening symptoms, fever, severe abdominal pain, blood in your stool.

## 2019-01-04 NOTE — ED Notes (Signed)
Patient transported to CT 

## 2020-05-11 IMAGING — CT CT ABD-PELV W/ CM
2 of 5 series · 16 of 46 positions shown, 18 images · IV contrast (Omni 300)
Comparison: CT of the abdomen and pelvis 05/13/2015

CLINICAL DATA: Midline abdominal pain. Abdominal pain, acute,
generalized. Personal history of umbilical hernia repair,
lithotripsy, and appendectomy.

EXAM:
CT ABDOMEN AND PELVIS WITH CONTRAST
TECHNIQUE: Multidetector CT imaging of the abdomen and pelvis was performed
using the standard protocol following bolus administration of
intravenous contrast.
CONTRAST:  100mL OMNIPAQUE IOHEXOL 300 MG/ML  SOLN

[Series 3: a/p w/ 5mm · axial · 0.89mm/px · z∈[+746,+1166]mm · 13 of 94 slices shown, 15 images]
[im 5/94  soft-tissue]
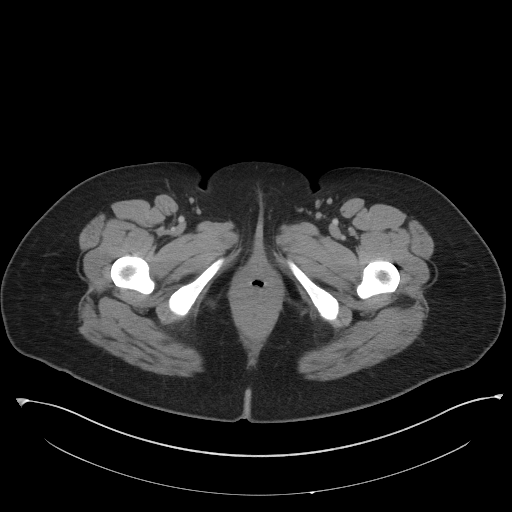
[im 5/94  bone]
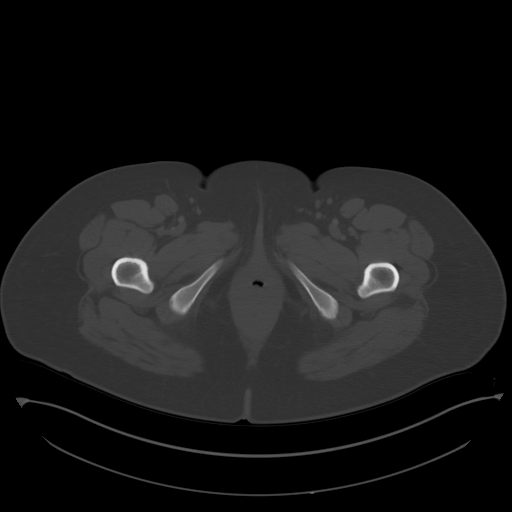
[im 15/94  soft-tissue]
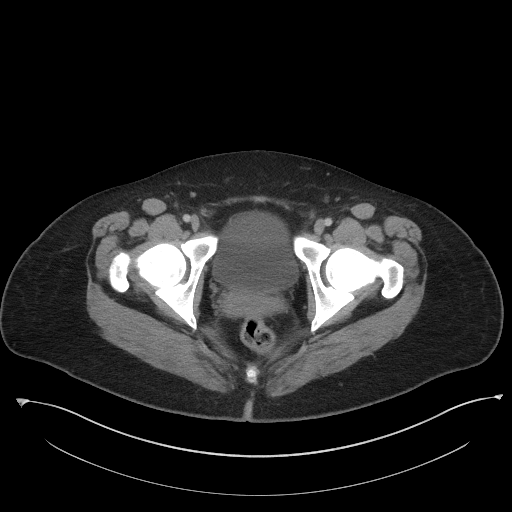
[im 20/94  soft-tissue]
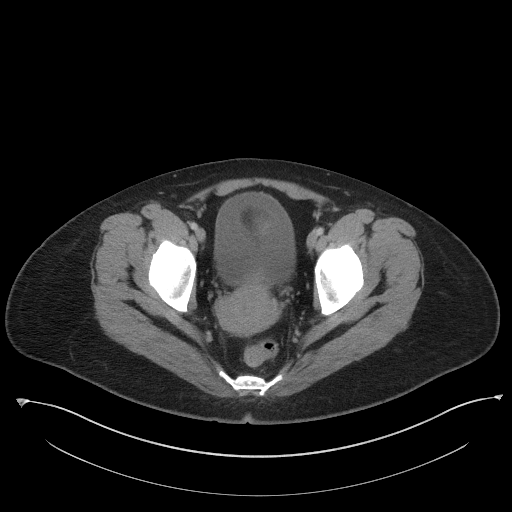
[im 25/94  soft-tissue]
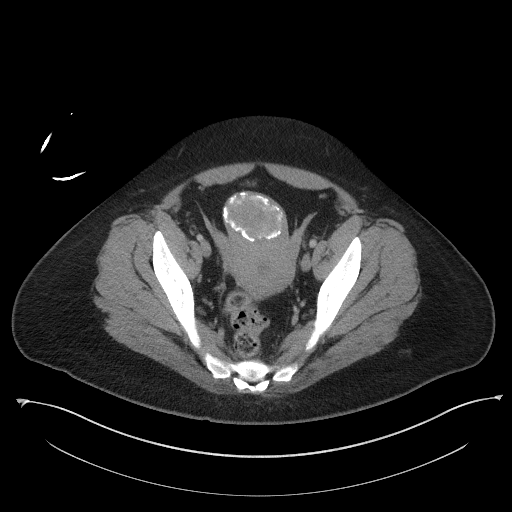
[im 35/94  soft-tissue]
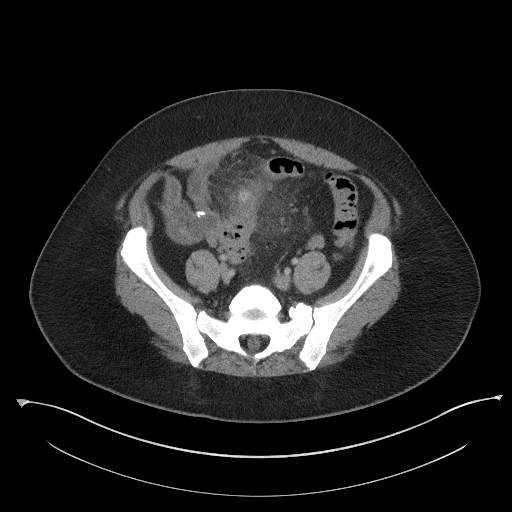
[im 40/94  soft-tissue]
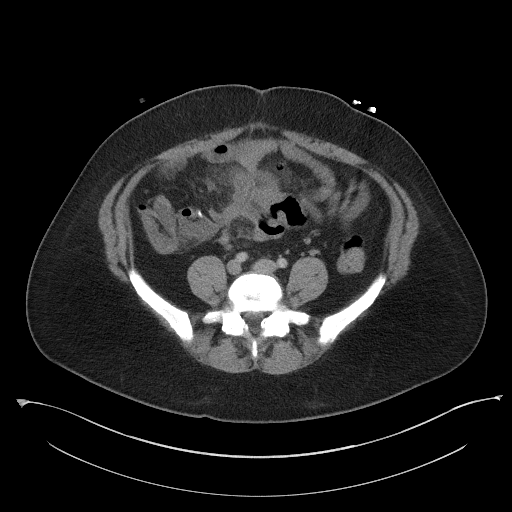
[im 49/94  soft-tissue]
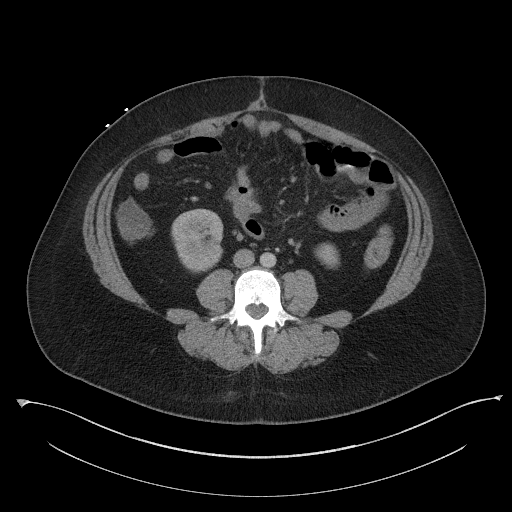
[im 54/94  soft-tissue]
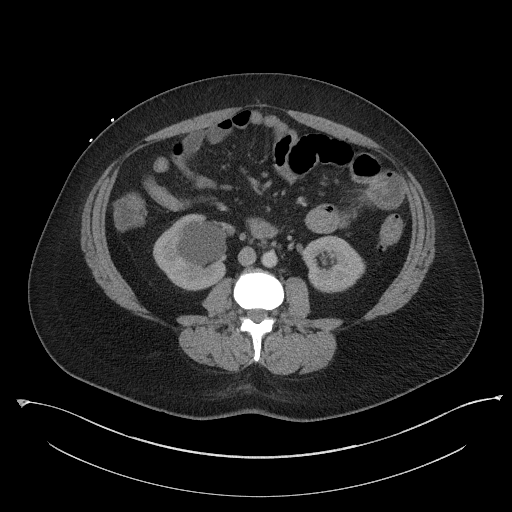
[im 59/94  soft-tissue]
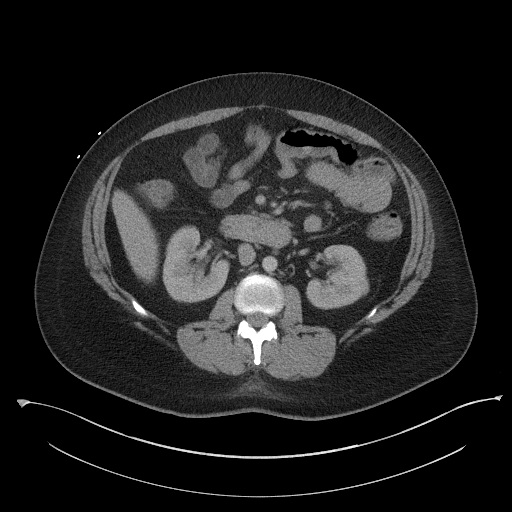
[im 59/94  bone]
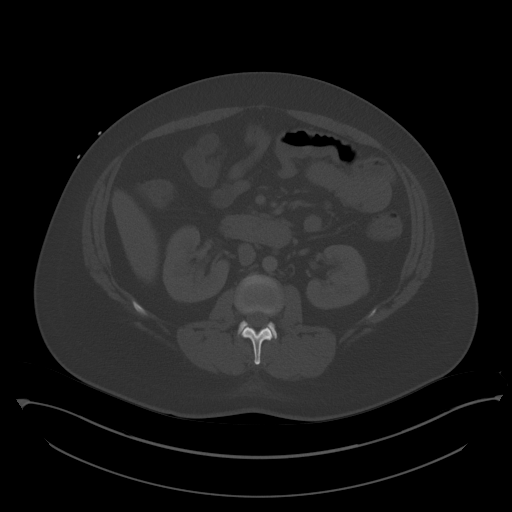
[im 69/94  soft-tissue]
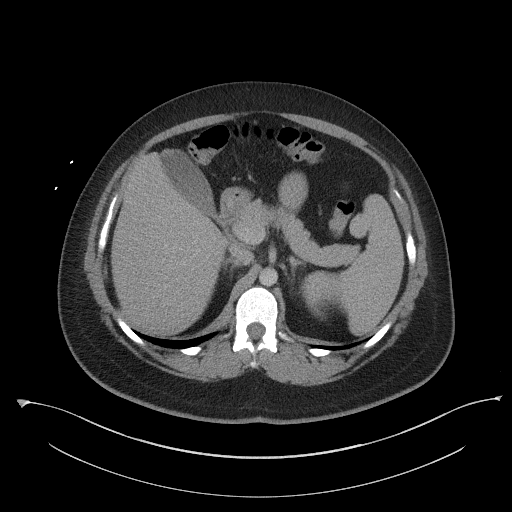
[im 74/94  soft-tissue]
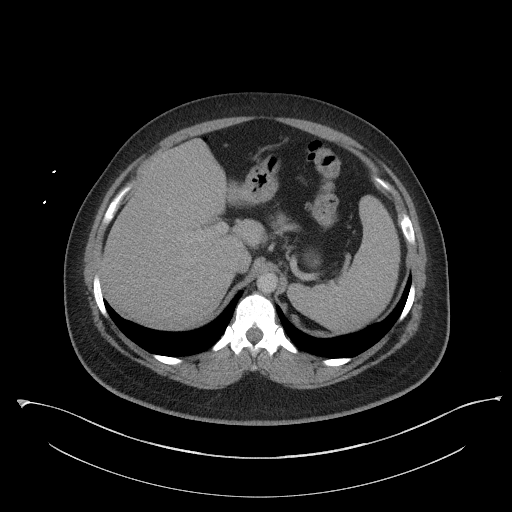
[im 79/94  soft-tissue]
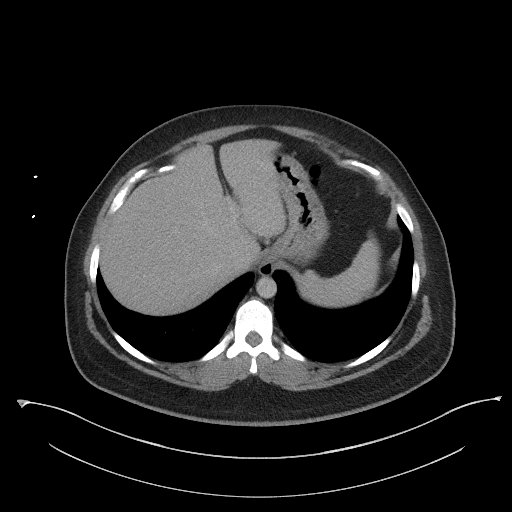
[im 89/94  soft-tissue]
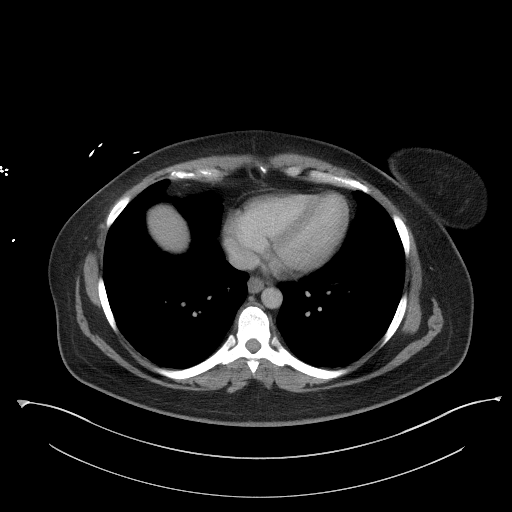

[Series 6: a/p w/ cor · coronal · 0.81mm/px · 3 of 150 slices shown]
[im 50/150  soft-tissue]
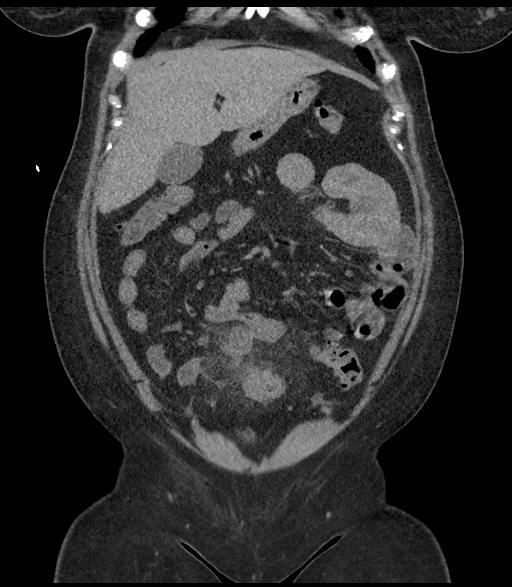
[im 67/150  soft-tissue]
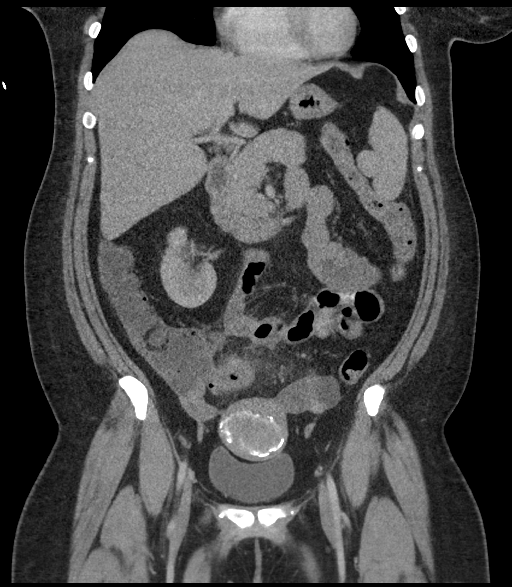
[im 83/150  soft-tissue]
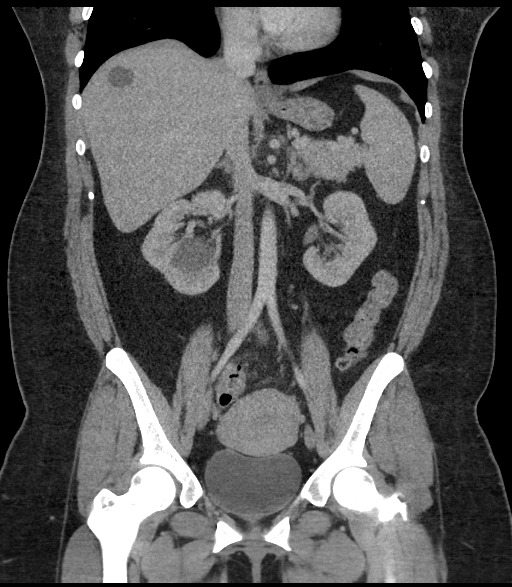

[16 of 46 positions shown; findings below may reference images not displayed]

FINDINGS: Lower chest: Lung bases are clear without focal nodule, mass, or
airspace disease. Heart size is normal. No significant pleural or
pericardial effusion is present.

Hepatobiliary: 2 cm cyst in the right lobe of the liver is stable.
Hypodense lesion inferiorly in the right lobe is also stable. No new
lesions are present. The common bile duct and gallbladder normal.

Pancreas: Unremarkable. No pancreatic ductal dilatation or
surrounding inflammatory changes.

Spleen: Normal in size without focal abnormality.

Adrenals/Urinary Tract: Adrenal glands are normal bilaterally. Lower
pole central sinus cyst of the right kidney has increased in size,
now measuring up to 4.4 cm. No other focal renal lesions are
present. There is no stone or obstruction. The urinary bladder is
within normal limits.

Stomach/Bowel: Stomach and duodenum are within normal limits. Small
bowel is within normal limits proximally. There is some secondary
inflammatory change distally. Appendectomy is noted. The ascending
colon is within normal limits. Transverse and proximal descending
colon are normal. There are some diverticular changes in the distal
descending colon. Extensive inflammatory changes are present about
the sigmoid colon compatible with acute diverticulitis. There is no
discrete abscess or free air.

Vascular/Lymphatic: No significant vascular findings are present. No
enlarged abdominal or pelvic lymph nodes.

Reproductive: A 4 cm fibroid is present at the uterine fundus.
Uterus is otherwise within normal limits. Multiple follicles are
present in the left adnexa measuring up to 2.4 cm.

Other: No free fluid or free air is present. No significant ventral
hernia is present.

Musculoskeletal: Transitional S1 segment is present. A vacuum disc
is present at L5-S1. Pelvis is intact. Hips are located and normal.
IMPRESSION: 1. Sigmoid diverticulitis without abscess or perforation.
2. Secondary inflammatory changes of the distal small bowel without
obstruction.
3. Enlarging central sinus cyst of the right kidney.
4. Benign liver cysts.

## 2024-02-11 ENCOUNTER — Encounter: Payer: Self-pay | Admitting: Cardiology

## 2024-02-11 ENCOUNTER — Encounter: Payer: Self-pay | Admitting: *Deleted

## 2024-02-11 DIAGNOSIS — N200 Calculus of kidney: Secondary | ICD-10-CM | POA: Insufficient documentation

## 2024-02-12 DIAGNOSIS — R002 Palpitations: Secondary | ICD-10-CM | POA: Insufficient documentation

## 2024-02-12 DIAGNOSIS — E785 Hyperlipidemia, unspecified: Secondary | ICD-10-CM | POA: Insufficient documentation

## 2024-02-18 ENCOUNTER — Encounter: Payer: Self-pay | Admitting: Cardiology

## 2024-02-18 ENCOUNTER — Ambulatory Visit: Attending: Cardiology | Admitting: Cardiology

## 2024-02-18 VITALS — BP 128/88 | HR 81 | Ht 62.0 in | Wt 206.8 lb

## 2024-02-18 DIAGNOSIS — R0609 Other forms of dyspnea: Secondary | ICD-10-CM

## 2024-02-18 DIAGNOSIS — R072 Precordial pain: Secondary | ICD-10-CM

## 2024-02-18 DIAGNOSIS — R002 Palpitations: Secondary | ICD-10-CM | POA: Diagnosis not present

## 2024-02-18 DIAGNOSIS — R0789 Other chest pain: Secondary | ICD-10-CM

## 2024-02-18 DIAGNOSIS — E785 Hyperlipidemia, unspecified: Secondary | ICD-10-CM | POA: Diagnosis not present

## 2024-02-18 DIAGNOSIS — I471 Supraventricular tachycardia, unspecified: Secondary | ICD-10-CM

## 2024-02-18 MED ORDER — METOPROLOL TARTRATE 100 MG PO TABS: ORAL_TABLET | ORAL | 0 refills | Status: DC

## 2024-02-18 NOTE — Patient Instructions (Signed)
 Medication Instructions:   TAKE: Metoprolol  100mg  1 tablet 2 hours prior to CT Scan   Lab Work: None Ordered If you have labs (blood work) drawn today and your tests are completely normal, you will receive your results only by: MyChart Message (if you have MyChart) OR A paper copy in the mail If you have any lab test that is abnormal or we need to change your treatment, we will call you to review the results.   Testing/Procedures:  Your physician has requested that you have an echocardiogram. Echocardiography is a painless test that uses sound waves to create images of your heart. It provides your doctor with information about the size and shape of your heart and how well your heart's chambers and valves are working. This procedure takes approximately one hour. There are no restrictions for this procedure. Please do NOT wear cologne, perfume, aftershave, or lotions (deodorant is allowed). Please arrive 15 minutes prior to your appointment time.  Please note: We ask at that you not bring children with you during ultrasound (echo/ vascular) testing. Due to room size and safety concerns, children are not allowed in the ultrasound rooms during exams. Our front office staff cannot provide observation of children in our lobby area while testing is being conducted. An adult accompanying a patient to their appointment will only be allowed in the ultrasound room at the discretion of the ultrasound technician under special circumstances. We apologize for any inconvenience.   Your cardiac CT will be scheduled at one of the below locations:   [facility] [address] [place], [place] [address]  Please follow these instructions carefully (unless otherwise directed):    On the Night Before the Test: Be sure to Drink plenty of water. Do not consume any caffeinated/decaffeinated beverages or chocolate 12 hours prior to your test. Do not take any antihistamines 12 hours prior to your  test.  On the Day of the Test: Drink plenty of water until 1 hour prior to the test. Do not eat any food 4 hours prior to the test. You may take your regular medications prior to the test.  Take metoprolol  (Lopressor ) two hours prior to test. FEMALES- please wear underwire-free bra if available, avoid dresses & tight clothing       After the Test: Drink plenty of water. After receiving IV contrast, you may experience a mild flushed feeling. This is normal. On occasion, you may experience a mild rash up to 24 hours after the test. This is not dangerous. If this occurs, you can take Benadryl 25 mg and increase your fluid intake. If you experience trouble breathing, this can be serious. If it is severe call 911 IMMEDIATELY. If it is mild, please call our office. If you take any of these medications: Glipizide/Metformin, Avandament, Glucavance, please do not take 48 hours after completing test unless otherwise instructed.  We will call to schedule your test 2-4 weeks out understanding that some insurance companies will need an authorization prior to the service being performed.   For non-scheduling related questions, please contact the cardiac imaging nurse navigator should you have any questions/concerns: Jinger Mount, Cardiac Imaging Nurse Navigator Chase Copping, Cardiac Imaging Nurse Navigator [facility] Direct Office Dial: [phone]   For scheduling needs, including cancellations and rescheduling, please call [place], [phone].    Follow-Up: At [facility], you and your health needs are our priority.  As part of our continuing mission to provide you with exceptional heart  care, we have created designated Provider Care Teams.  These Care Teams include your primary Cardiologist (physician) and Advanced Practice Providers (APPs -  Physician Assistants and Nurse Practitioners) who all work together to provide you with the care you need, when you need  it.  We recommend signing up for the patient portal called "MyChart".  Sign up information is provided on this After Visit Summary.  MyChart is used to connect with patients for Virtual Visits (Telemedicine).  Patients are able to view lab/test results, encounter notes, upcoming appointments, etc.  Non-urgent messages can be sent to your provider as well.   To learn more about what you can do with MyChart, go to [email or web address].    Your next appointment:   2 month(s)  The format for your next appointment:   In Person  Provider:   Ralene Burger, MD    Other Instructions NA

## 2024-02-18 NOTE — Progress Notes (Unsigned)
 Cardiology Consultation:    Date:  02/18/2024   ID:  Emily [name], DOB Dec 17, 1975, MRN [id number]  PCP:  Emily Ends, PA-C  Cardiologist:  Ralene Burger, MD   Referring MD: Emily Ends, PA-C   Chief Complaint  Patient presents with   Tachycardia   Chest Pain    History of Present Illness:    Emily [name] is a 48 y.o. female who is being seen today for the evaluation of palpitations, chest pain at the request of Dickinson, Amy H, PA-C.  No significant past medical history except for dyslipidemia for last few months she started experiencing palpitations she said out of blue her heart will speed up does not have any dizziness no sweating no chest pain or shortness of breath associated with this sensation it happens every couple days recently she wore monitor which showed supraventricular tachycardia but that was asymptomatic.  She did trigger events and those triggered events showed sinus tachycardia.  Recently she started walking on the regular basis when she walk on the regular basis she gets short of breath quite easily and she also complained of having some uneasy sensation in the chest when she is walking especially when she put herself heart she slows down the sensation goes away.  Interesting she does not have many risk factors for coronary artery disease.  Her grandfather did have premature coronary disease but he was a smoker.  Apparently she had high cholesterol but I do not have any numbers, never smoked does not have diabetes and hypertension.  She did not exercise on the regular basis.  She snores at night but cannot tell me if she stopped breathing at night.  She is not on any special diet.  Past Medical History:  Diagnosis Date   Cervical polyp 12/24/2016   Dyslipidemia    Palpitations    Recurrent nephrolithiasis     Past Surgical History:  Procedure Laterality Date   APPENDECTOMY     BREAST LUMPECTOMY     CERVICAL POLYPECTOMY  12/28/2016   LITHOTRIPSY      UMBILICAL HERNIA REPAIR N/A 10/16/2017   Procedure: UMBILICAL HERNIA REPAIR;  Surgeon: Oza Blumenthal, MD;  Location: [facility];  Service: General;  Laterality: N/A;    Current Medications: Current Meds  Medication Sig   aspirin EC 81 MG tablet Take 81 mg by mouth daily. Swallow whole.   cyanocobalamin (VITAMIN B12) 1000 MCG tablet Take 1,000 mcg by mouth daily.   [DISCONTINUED] nitrofurantoin, macrocrystal-monohydrate, (MACROBID) 100 MG capsule Take 100 mg by mouth 2 (two) times daily.     Allergies:   Patient has no known allergies.   Social History   Socioeconomic History   Marital status: Married    Spouse name: Not on file   Number of children: Not on file   Years of education: Not on file   Highest education level: Not on file  Occupational History   Not on file  Tobacco Use   Smoking status: Never   Smokeless tobacco: Never  Vaping Use   Vaping status: Never Used  Substance and Sexual Activity   Alcohol use: No   Drug use: No   Sexual activity: Yes  Other Topics Concern   Not on file  Social History Narrative   Not on file   Social Drivers of Health   Financial Resource Strain: Not on file  Food Insecurity: Not on file  Transportation Needs: Not on file  Physical Activity: Not on file  Stress: Not on file  Social Connections: Not on file     Family History: The patient's family history includes Colon polyps in her father and mother; Heart attack in her maternal grandfather; Nephrolithiasis in her mother; Thyroid disease in her maternal grandmother. ROS:   Please see the history of present illness.    All 14 point review of systems negative except as described per history of present illness.  EKGs/Labs/Other Studies Reviewed:    The following studies were reviewed today:   EKG:  EKG Interpretation Date/Time:  Tuesday February 18 2024 08:49:20 EDT Ventricular Rate:  81 PR Interval:  170 QRS Duration:  92 QT Interval:  384 QTC  Calculation: 446 R Axis:   25  Text Interpretation: Normal sinus rhythm Low voltage QRS Incomplete right bundle branch block Borderline ECG No previous ECGs available Confirmed by Ralene Burger [phone]) on 02/18/2024 8:51:52 AM    Recent Labs: No results found for requested labs within last 365 days.  Recent Lipid Panel No results found for: "CHOL", "TRIG", "HDL", "CHOLHDL", "VLDL", "LDLCALC", "LDLDIRECT"  Physical Exam:    VS:  BP 128/88 (BP Location: Right Arm, Patient Position: Sitting)   Pulse 81   Ht 5\' 2"  (1.575 m)   Wt 206 lb 12.8 oz (93.8 kg)   SpO2 96%   BMI 37.82 kg/m     Wt Readings from Last 3 Encounters:  02/18/24 206 lb 12.8 oz (93.8 kg)  01/02/24 204 lb (92.5 kg)  01/04/19 199 lb (90.3 kg)     GEN:  Well nourished, well developed in no acute distress HEENT: Normal NECK: No JVD; No carotid bruits LYMPHATICS: No lymphadenopathy CARDIAC: RRR, no murmurs, no rubs, no gallops RESPIRATORY:  Clear to auscultation without rales, wheezing or rhonchi  ABDOMEN: Soft, non-tender, non-distended MUSCULOSKELETAL:  No edema; No deformity  SKIN: Warm and dry NEUROLOGIC:  Alert and oriented x 3 PSYCHIATRIC:  Normal affect   ASSESSMENT:    1. Palpitations   2. Dyslipidemia   3. Supraventricular tachycardia (HCC)   4. Atypical chest pain    PLAN:    In order of problems listed above:  Palpitations that she did wear monitor which showed supraventricular tachycardia however it was asymptomatic we initiated conversation about potentially starting small dose of beta-blocker she is not interested as a part of stratification of this arrhythmia will get echocardiogram. Dyspnea on exertion: Echocardiogram will be done to assess left ventricle ejection fraction. Atypical chest pain.  She does develop some stress sensation she described while walking we will schedule her to have coronary CT angio make sure she does not have any significant obstructive disease. In the meantime I  encouraged her to be active however asked her not to push herself too hard until get those test available. Snoring since she determined to start exercising on the regular basis I anticipate her to lose some weight if she truly got sleep apnea that should help with this as well. Will wait for results of her fasting lipid profile from primary care physician   Medication Adjustments/Labs and Tests Ordered: Current medicines are reviewed at length with the patient today.  Concerns regarding medicines are outlined above.  Orders Placed This Encounter  Procedures   EKG 12-Lead   No orders of the defined types were placed in this encounter.   Signed, Manfred Seed, MD, [facility]. 02/18/2024 9:14 AM    [facility]

## 2024-02-18 NOTE — Progress Notes (Unsigned)
 Emily [name]

## 2024-02-19 ENCOUNTER — Ambulatory Visit: Attending: Cardiology

## 2024-02-19 DIAGNOSIS — R0609 Other forms of dyspnea: Secondary | ICD-10-CM

## 2024-02-20 LAB — ECHOCARDIOGRAM COMPLETE
Area-P 1/2: 3.29 cm2
S' Lateral: 2.5 cm

## 2024-02-24 ENCOUNTER — Encounter (HOSPITAL_COMMUNITY): Payer: Self-pay

## 2024-02-25 ENCOUNTER — Ambulatory Visit (HOSPITAL_BASED_OUTPATIENT_CLINIC_OR_DEPARTMENT_OTHER)
Admission: RE | Admit: 2024-02-25 | Discharge: 2024-02-25 | Disposition: A | Discharge status: Home / Self Care | Source: Ambulatory Visit | Attending: Cardiology | Admitting: Cardiology

## 2024-02-25 DIAGNOSIS — R072 Precordial pain: Secondary | ICD-10-CM | POA: Diagnosis present

## 2024-02-25 MED ORDER — METOPROLOL TARTRATE 5 MG/5ML IV SOLN
10.0000 mg | Freq: Once | INTRAVENOUS | Status: AC
  Administered 2024-02-25: 10 mg via INTRAVENOUS

## 2024-02-25 MED ORDER — DILTIAZEM HCL 25 MG/5ML IV SOLN
INTRAVENOUS | Status: AC
  Filled 2024-02-25: qty 5

## 2024-02-25 MED ORDER — NITROGLYCERIN 0.4 MG SL SUBL
0.8000 mg | SUBLINGUAL_TABLET | Freq: Once | SUBLINGUAL | Status: AC
  Administered 2024-02-25: 0.8 mg via SUBLINGUAL

## 2024-02-25 MED ORDER — IOHEXOL 350 MG/ML SOLN
95.0000 mL | Freq: Once | INTRAVENOUS | Status: AC | PRN
  Administered 2024-02-25: 95 mL via INTRAVENOUS

## 2024-02-25 MED ORDER — METOPROLOL TARTRATE 5 MG/5ML IV SOLN
INTRAVENOUS | Status: AC
  Filled 2024-02-25: qty 15

## 2024-03-10 ENCOUNTER — Ambulatory Visit: Payer: Self-pay | Admitting: Cardiology

## 2024-03-11 NOTE — Telephone Encounter (Signed)
LM informing the patient of results(ok per DPR)

## 2024-03-11 NOTE — Progress Notes (Signed)
LM informing the patient of results(ok per DPR)

## 2024-03-11 NOTE — Telephone Encounter (Signed)
-----   Message from Ralene Burger sent at 02/28/2024  9:24 AM EDT ----- Echocardiogram showed normal left ventricle ejection fraction trivial mitral valve regurgitation

## 2024-05-08 ENCOUNTER — Encounter: Payer: Self-pay | Admitting: Cardiology

## 2024-05-08 ENCOUNTER — Ambulatory Visit: Attending: Cardiology | Admitting: Cardiology

## 2024-05-08 VITALS — BP 112/68 | HR 76 | Ht 62.0 in | Wt 210.8 lb

## 2024-05-08 DIAGNOSIS — I471 Supraventricular tachycardia, unspecified: Secondary | ICD-10-CM

## 2024-05-08 DIAGNOSIS — R0789 Other chest pain: Secondary | ICD-10-CM | POA: Diagnosis not present

## 2024-05-08 DIAGNOSIS — R002 Palpitations: Secondary | ICD-10-CM | POA: Diagnosis not present

## 2024-05-08 DIAGNOSIS — E785 Hyperlipidemia, unspecified: Secondary | ICD-10-CM

## 2024-05-08 NOTE — Patient Instructions (Signed)
Medication Instructions:  Your physician recommends that you continue on your current medications as directed. Please refer to the Current Medication list given to you today.  *If you need a refill on your cardiac medications before your next appointment, please call your pharmacy*   Lab Work: None Ordered If you have labs (blood work) drawn today and your tests are completely normal, you will receive your results only by: MyChart Message (if you have MyChart) OR A paper copy in the mail If you have any lab test that is abnormal or we need to change your treatment, we will call you to review the results.   Testing/Procedures: None Ordered   Follow-Up: At [facility], you and your health needs are our priority.  As part of our continuing mission to provide you with exceptional heart care, we have created designated Provider Care Teams.  These Care Teams include your primary Cardiologist (physician) and Advanced Practice Providers (APPs -  Physician Assistants and Nurse Practitioners) who all work together to provide you with the care you need, when you need it.  We recommend signing up for the patient portal called "MyChart".  Sign up information is provided on this After Visit Summary.  MyChart is used to connect with patients for Virtual Visits (Telemedicine).  Patients are able to view lab/test results, encounter notes, upcoming appointments, etc.  Non-urgent messages can be sent to your provider as well.   To learn more about what you can do with MyChart, go to [email or web address].    Your next appointment:   As needed  The format for your next appointment:   In Person  Provider:   Robert Krasowski, MD    Other Instructions NA  

## 2024-05-08 NOTE — Progress Notes (Signed)
 Cardiology Office Note:    Date:  05/08/2024   ID:  Emily [name], DOB 1976-09-15, MRN [id number]  PCP:  Fritz Greig DEL, PA-C  Cardiologist:  Lamar Fitch, MD    Referring MD: Fritz Greig DEL, PA-C   Chief Complaint  Patient presents with   Results    History of Present Illness:    Emily [name] is a 48 y.o. female came to us  initially because of palpitations monitor showed some supraventricular tachycardia and extrasystole she was offered medication refused, because of atypical chest pain coronary CT angio has been done calcium score 0 no coronary artery disease, normal coronaries, she also had echocardiogram showed preserved left ventricular ejection fraction.  She comes today to talk about this.  Doing great asymptomatic very happy with results of the testing all tests were normal.  Past Medical History:  Diagnosis Date   Cervical polyp 12/24/2016   Dyslipidemia    Palpitations    Recurrent nephrolithiasis     Past Surgical History:  Procedure Laterality Date   APPENDECTOMY     BREAST LUMPECTOMY     CERVICAL POLYPECTOMY  12/28/2016   LITHOTRIPSY     UMBILICAL HERNIA REPAIR N/A 10/16/2017   Procedure: UMBILICAL HERNIA REPAIR;  Surgeon: Vernetta Berg, MD;  Location: [facility];  Service: General;  Laterality: N/A;    Current Medications: Current Meds  Medication Sig   aspirin EC 81 MG tablet Take 81 mg by mouth daily. Swallow whole.   cyanocobalamin (VITAMIN B12) 1000 MCG tablet Take 1,000 mcg by mouth daily.   metoprolol  tartrate (LOPRESSOR ) 100 MG tablet Take one tablet 2 hours before cardiac CT for heart greater than 55     Allergies:   Patient has no known allergies.   Social History   Socioeconomic History   Marital status: Married    Spouse name: Not on file   Number of children: Not on file   Years of education: Not on file   Highest education level: Not on file  Occupational History   Not on file  Tobacco Use   Smoking  status: Never   Smokeless tobacco: Never  Vaping Use   Vaping status: Never Used  Substance and Sexual Activity   Alcohol use: No   Drug use: No   Sexual activity: Yes  Other Topics Concern   Not on file  Social History Narrative   Not on file   Social Drivers of Health   Financial Resource Strain: Not on file  Food Insecurity: Not on file  Transportation Needs: Not on file  Physical Activity: Not on file  Stress: Not on file  Social Connections: Not on file     Family History: The patient's family history includes Colon polyps in her father and mother; Heart attack in her maternal grandfather; Nephrolithiasis in her mother; Thyroid disease in her maternal grandmother. ROS:   Please see the history of present illness.    All 14 point review of systems negative except as described per history of present illness  EKGs/Labs/Other Studies Reviewed:         Recent Labs: No results found for requested labs within last 365 days.  Recent Lipid Panel No results found for: CHOL, TRIG, HDL, CHOLHDL, VLDL, LDLCALC, LDLDIRECT  Physical Exam:    VS:  BP 112/68 (BP Location: Right Arm, Patient Position: Sitting)   Pulse 76   Ht 5' 2 (1.575 m)   Wt 210 lb 12.8 oz (95.6 kg)   SpO2 92%  BMI 38.56 kg/m     Wt Readings from Last 3 Encounters:  05/08/24 210 lb 12.8 oz (95.6 kg)  02/18/24 206 lb 12.8 oz (93.8 kg)  01/02/24 204 lb (92.5 kg)     GEN:  Well nourished, well developed in no acute distress HEENT: Normal NECK: No JVD; No carotid bruits LYMPHATICS: No lymphadenopathy CARDIAC: RRR, no murmurs, no rubs, no gallops RESPIRATORY:  Clear to auscultation without rales, wheezing or rhonchi  ABDOMEN: Soft, non-tender, non-distended MUSCULOSKELETAL:  No edema; No deformity  SKIN: Warm and dry LOWER EXTREMITIES: no swelling NEUROLOGIC:  Alert and oriented x 3 PSYCHIATRIC:  Normal affect   ASSESSMENT:    1. Supraventricular tachycardia (HCC)   2. Atypical  chest pain   3. Dyslipidemia   4. Palpitations    PLAN:    In order of problems listed above:  Supraventricular tachycardia I told her 1 more time about possibility of medication she does not want to do that she said she is fine without it we will continue monitoring I told her if she developed some more worrisome palpitations she wants to try medication, open for option of sending her metoprolol  to pharmacy. Atypical chest pain denies having any coronary CT angio normal. Dyslipidemia total cholesterol 213 HDL 82.  Continue present management in view of the fact calcium score is 0 and coronary CT angio normal. Palpitations's discussion as above   Medication Adjustments/Labs and Tests Ordered: Current medicines are reviewed at length with the patient today.  Concerns regarding medicines are outlined above.  No orders of the defined types were placed in this encounter.  Medication changes: No orders of the defined types were placed in this encounter.   Signed, Lamar DOROTHA Fitch, MD, [facility] 05/08/2024 9:44 AM    [facility]

## 2024-11-01 ENCOUNTER — Encounter (HOSPITAL_BASED_OUTPATIENT_CLINIC_OR_DEPARTMENT_OTHER): Payer: Self-pay

## 2024-11-01 ENCOUNTER — Ambulatory Visit (HOSPITAL_BASED_OUTPATIENT_CLINIC_OR_DEPARTMENT_OTHER)
Admission: EM | Admit: 2024-11-01 | Discharge: 2024-11-01 | Disposition: A | Discharge status: Home / Self Care | Attending: Family Medicine | Admitting: Family Medicine

## 2024-11-01 DIAGNOSIS — R052 Subacute cough: Secondary | ICD-10-CM | POA: Diagnosis not present

## 2024-11-01 MED ORDER — PREDNISONE 20 MG PO TABS: 40.0000 mg | ORAL_TABLET | Freq: Every day | ORAL | 0 refills | Status: AC

## 2024-11-01 MED ORDER — AZITHROMYCIN 250 MG PO TABS: 250.0000 mg | ORAL_TABLET | Freq: Every day | ORAL | 0 refills | Status: AC

## 2024-11-01 NOTE — ED Provider Notes (Signed)
 " [facility]    CSN: [id number] Arrival date & time: 11/01/24  1524      History   Chief Complaint Chief Complaint  Patient presents with   Cough    HPI Emily [name] is a 49 y.o. female.   Pt c/o cough-productive, chest tightness, and bilateral upper back pain for the last 3 weeks. In the beginning she was taking mucinex but has not taken anything in the last week. No fever, chills, body aches.     Cough   Past Medical History:  Diagnosis Date   Cervical polyp 12/24/2016   Dyslipidemia    Palpitations    Recurrent nephrolithiasis     Patient Active Problem List   Diagnosis Date Noted   Supraventricular tachycardia 02/18/2024   Atypical chest pain 02/18/2024   Dyslipidemia    Palpitations    Recurrent nephrolithiasis    Cervical polyp 12/24/2016    Past Surgical History:  Procedure Laterality Date   APPENDECTOMY     BREAST LUMPECTOMY     CERVICAL POLYPECTOMY  12/28/2016   LITHOTRIPSY     UMBILICAL HERNIA REPAIR N/A 10/16/2017   Procedure: UMBILICAL HERNIA REPAIR;  Surgeon: Vernetta Berg, MD;  Location: [facility];  Service: General;  Laterality: N/A;    OB History   No obstetric history on file.      Home Medications    Prior to Admission medications  Medication Sig Start Date End Date Taking? Authorizing Provider  azithromycin  (ZITHROMAX ) 250 MG tablet Take 1 tablet (250 mg total) by mouth daily. Take first 2 tablets together, then 1 every day until finished. 11/01/24  Yes Royer Cristobal A, FNP  predniSONE  (DELTASONE ) 20 MG tablet Take 2 tablets (40 mg total) by mouth daily with breakfast for 5 days. 11/01/24 11/06/24 Yes Annelies Coyt A, FNP  aspirin EC 81 MG tablet Take 81 mg by mouth daily. Swallow whole.    [provider]  cyanocobalamin (VITAMIN B12) 1000 MCG tablet Take 1,000 mcg by mouth daily.    [provider]    Family History Family History  Problem Relation Age of Onset   Nephrolithiasis Mother     Colon polyps Mother    Colon polyps Father    Thyroid disease Maternal Grandmother    Heart attack Maternal Grandfather     Social History Social History[1]   Allergies   Patient has no known allergies.   Review of Systems Review of Systems  Respiratory:  Positive for cough.      Physical Exam Triage Vital Signs ED Triage Vitals  Encounter Vitals Group     BP 11/01/24 1551 139/84     Girls Systolic BP Percentile --      Girls Diastolic BP Percentile --      Boys Systolic BP Percentile --      Boys Diastolic BP Percentile --      Pulse Rate 11/01/24 1551 (!) 104     Resp 11/01/24 1551 20     Temp 11/01/24 1551 98.5 F (36.9 C)     Temp Source 11/01/24 1551 Oral     SpO2 11/01/24 1551 93 %     Weight --      Height --      Head Circumference --      Peak Flow --      Pain Score 11/01/24 1549 0     Pain Loc --      Pain Education --  Exclude from Growth Chart --    No data found.  Updated Vital Signs BP 139/84 (BP Location: Right Arm)   Pulse (!) 104   Temp 98.5 F (36.9 C) (Oral)   Resp 20   LMP 10/10/2024 (Exact Date)   SpO2 93%   Visual Acuity Right Eye Distance:   Left Eye Distance:   Bilateral Distance:    Right Eye Near:   Left Eye Near:    Bilateral Near:     Physical Exam Constitutional:      General: She is not in acute distress.    Appearance: Normal appearance. She is not ill-appearing, toxic-appearing or diaphoretic.  HENT:     Head: Normocephalic and atraumatic.     Right Ear: Tympanic membrane and ear canal normal.     Left Ear: Tympanic membrane and ear canal normal.     Nose: Congestion present.     Mouth/Throat:     Pharynx: Oropharynx is clear.  Eyes:     Conjunctiva/sclera: Conjunctivae normal.  Cardiovascular:     Rate and Rhythm: Normal rate and regular rhythm.     Pulses: Normal pulses.     Heart sounds: Normal heart sounds.  Pulmonary:     Effort: Pulmonary effort is normal.     Breath sounds: Normal breath  sounds.  Skin:    General: Skin is warm and dry.  Neurological:     Mental Status: She is alert.  Psychiatric:        Mood and Affect: Mood normal.      UC Treatments / Results  Labs (all labs ordered are listed, but only abnormal results are displayed) Labs Reviewed - No data to display  EKG   Radiology No results found.  Procedures Procedures (including critical care time)  Medications Ordered in UC Medications - No data to display  Initial Impression / Assessment and Plan / UC Course  I have reviewed the triage vital signs and the nursing notes.  Pertinent labs & imaging results that were available during my care of the patient were reviewed by me and considered in my medical decision making (see chart for details).     Subacute cough-symptoms for over 3 weeks.  Treating for bronchitis at this time. Medications as prescribed. Cough medication as needed.  Over-the-counter medications as needed for symptoms. Follow-up as needed Final Clinical Impressions(s) / UC Diagnoses   Final diagnoses:  Subacute cough     Discharge Instructions      Take the medication as prescribed for bronchitis. Cough medication as needed.  Over-the-counter medications as needed for symptoms. Follow-up as needed    ED Prescriptions     Medication Sig Dispense Auth. Provider   predniSONE  (DELTASONE ) 20 MG tablet Take 2 tablets (40 mg total) by mouth daily with breakfast for 5 days. 10 tablet Ysmael Hires A, FNP   azithromycin  (ZITHROMAX ) 250 MG tablet Take 1 tablet (250 mg total) by mouth daily. Take first 2 tablets together, then 1 every day until finished. 6 tablet Adah Wilbert LABOR, FNP      PDMP not reviewed this encounter.     [1]  Social History Tobacco Use   Smoking status: Never   Smokeless tobacco: Never  Vaping Use   Vaping status: Never Used  Substance Use Topics   Alcohol use: No   Drug use: No     Adah Wilbert LABOR, FNP 11/04/24 0816  "

## 2024-11-01 NOTE — ED Triage Notes (Signed)
 Pt c/o cough-productive, chest tightness, and bilateral upper back pain for the last 3 weeks. In the beginning she was taking mucinex but has not taken anything in the last week.

## 2024-11-01 NOTE — Discharge Instructions (Signed)
 Take the medication as prescribed for bronchitis. Cough medication as needed.  Over-the-counter medications as needed for symptoms. Follow-up as needed
